# Patient Record
Sex: Male | Born: 1990 | Race: White | Hispanic: No | Marital: Single | Smoking: Never smoker
Health system: Southern US, Community
[De-identification: ages and names within clinical notes are randomized; demographics above are authoritative.]

## PROBLEM LIST (undated history)

## (undated) DIAGNOSIS — T7840XA Allergy, unspecified, initial encounter: Secondary | ICD-10-CM

## (undated) HISTORY — DX: Allergy, unspecified, initial encounter: T78.40XA

---
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
## 2015-01-31 ENCOUNTER — Ambulatory Visit (INDEPENDENT_AMBULATORY_CARE_PROVIDER_SITE_OTHER): Payer: BLUE CROSS/BLUE SHIELD | Admitting: Emergency Medicine

## 2015-01-31 VITALS — BP 114/74 | HR 60 | Temp 98.4°F | Resp 16 | Ht 73.25 in | Wt 283.0 lb

## 2015-01-31 DIAGNOSIS — K921 Melena: Secondary | ICD-10-CM | POA: Diagnosis not present

## 2015-01-31 MED ORDER — POLYETHYLENE GLYCOL 3350 17 GM/SCOOP PO POWD: 17.0000 g | Freq: Two times a day (BID) | ORAL | Status: AC | PRN

## 2015-01-31 NOTE — Progress Notes (Signed)
[facility] [address], [place] [id number] [phone]  Date:  01/31/2015   Name:  Dollene ClevelandMicah A Coltrain   DOB:  1991/08/15   MRN:  [id number]  PCP:  No primary care provider on file.    Chief Complaint: Hemorrhoids   History of Present Illness:  Andre Curtis is a 24 y.o. very pleasant male patient who presents with the following:  Has BRBPR since thanksgiving on a weekly basis No painful stools.   No masses No constipation. No stool change Thinks he has hemorrhoids Says blood coats the stools No history of anorectal disease No improvement with over the counter medications or other home remedies.  Denies other complaint or health concern today.   There are no active problems to display for this patient.   Past Medical History  Diagnosis Date  . Allergy     History reviewed. No pertinent past surgical history.  History  Substance Use Topics  . Smoking status: Never Smoker   . Smokeless tobacco: Not on file  . Alcohol Use: 3.0 oz/week    5 Standard drinks or equivalent per week    Family History  Problem Relation Age of Onset  . Cancer Father   . Cancer Maternal Grandfather   . Cancer Paternal Grandmother   . Cancer Paternal Grandfather   . Hyperlipidemia Paternal Grandfather   . Hypertension Paternal Grandfather     Not on File  Medication list has been reviewed and updated.  No current outpatient prescriptions on file prior to visit.   No current facility-administered medications on file prior to visit.    Review of Systems:   As per HPI, otherwise negative.    Physical Examination: Filed Vitals:   01/31/15 1655  BP: 114/74  Pulse: 60  Temp: 98.4 F (36.9 C)  Resp: 16   Filed Vitals:   01/31/15 1655  Height: 6' 1.25" (1.861 m)  Weight: 283 lb (128.368 kg)   Body mass index is 37.07 kg/(m^2). Ideal Body Weight: Weight in (lb) to have BMI = 25: 190.4  GEN: WDWN, NAD, Non-toxic, A & O x 3 HEENT: Atraumatic,  Normocephalic. Neck supple. No masses, No LAD. Ears and Nose: No external deformity. CV: RRR, No M/G/R. No JVD. No thrill. No extra heart sounds. PULM: CTA B, no wheezes, crackles, rhonchi. No retractions. No resp. distress. No accessory muscle use. ABD: S, NT, ND, +BS. No rebound. No HSM. EXTR: No c/c/e NEURO Normal gait.  PSYCH: Normally interactive. Conversant. Not depressed or anxious appearing.  Calm demeanor.  DRE:  Anal skin tags.  No fissure or hemorrhoids.   Assessment and Plan: [facility]  Signed,  Phillips OdorJeffery Anderson, MD
